# Patient Record
Sex: Male | Born: 1965 | Race: Black or African American | Hispanic: No | Marital: Single | Smoking: Former smoker
Health system: Southern US, Community
[De-identification: ages and names within clinical notes are randomized; demographics above are authoritative.]

## PROBLEM LIST (undated history)

## (undated) DIAGNOSIS — I10 Essential (primary) hypertension: Secondary | ICD-10-CM

## (undated) HISTORY — DX: Essential (primary) hypertension: I10

---

## 2007-01-04 ENCOUNTER — Emergency Department: Payer: Self-pay | Admitting: Emergency Medicine

## 2007-07-05 ENCOUNTER — Emergency Department: Payer: Self-pay | Admitting: Emergency Medicine

## 2007-07-05 ENCOUNTER — Other Ambulatory Visit: Payer: Self-pay

## 2008-11-23 ENCOUNTER — Emergency Department: Payer: Self-pay | Admitting: Emergency Medicine

## 2009-01-16 ENCOUNTER — Emergency Department (HOSPITAL_COMMUNITY): Admission: EM | Admit: 2009-01-16 | Discharge: 2009-01-17 | Payer: Self-pay | Admitting: Emergency Medicine

## 2009-01-16 ENCOUNTER — Emergency Department (HOSPITAL_COMMUNITY): Admission: EM | Admit: 2009-01-16 | Discharge: 2009-01-16 | Payer: Self-pay | Admitting: Emergency Medicine

## 2009-02-01 IMAGING — CT CT HEAD WITHOUT CONTRAST
2 series · 16 of 30 positions shown, 20 images · non-contrast
Comparison: none

REASON FOR EXAM: Dizziness
COMMENTS:

[Series 2: without · axial · non-contrast · 0.40mm/px · z∈[+234,+354]mm · 13 of 30 slices shown, 17 images]
[im 3/30  brain]
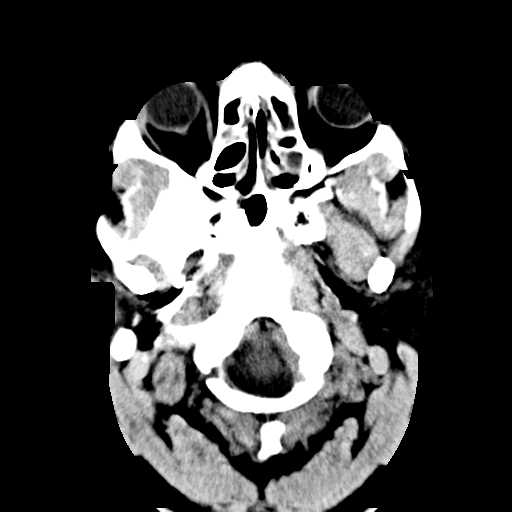
[im 3/30  bone]
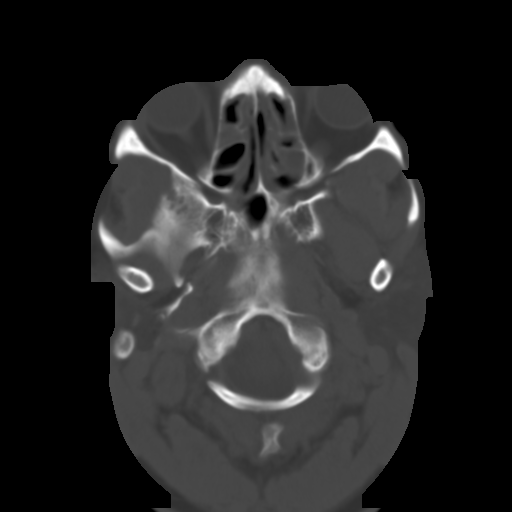
[im 5/30  brain]
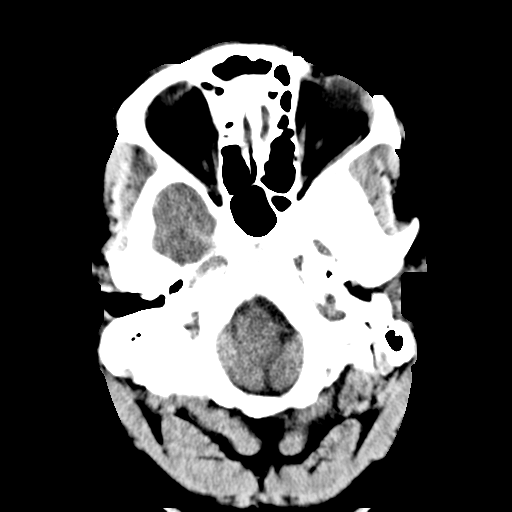
[im 7/30  brain]
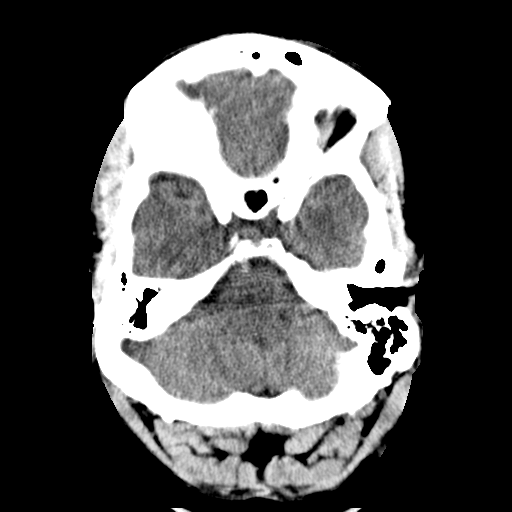
[im 9/30  brain]
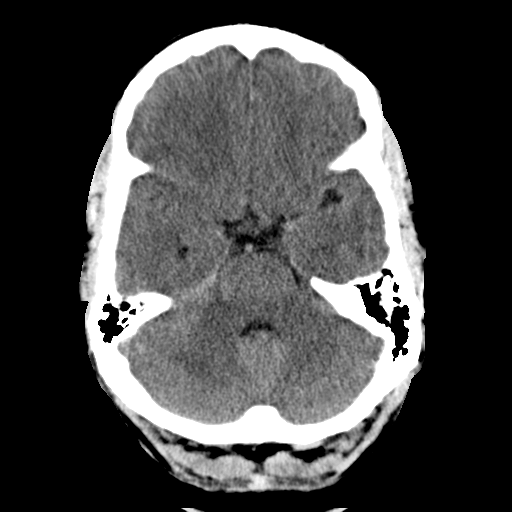
[im 11/30  brain]
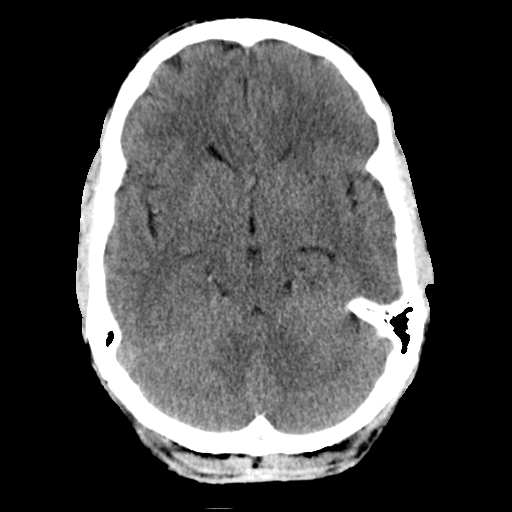
[im 11/30  bone]
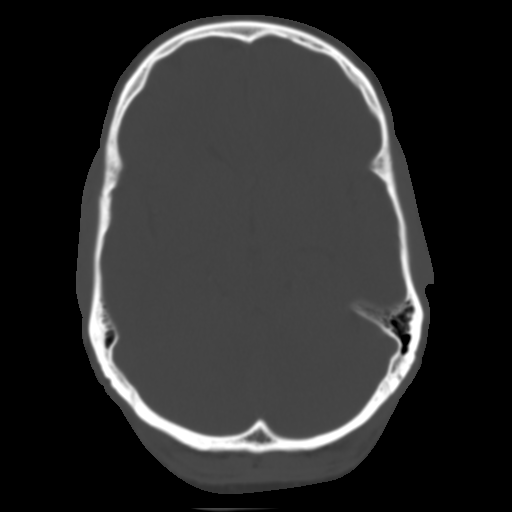
[im 13/30  brain]
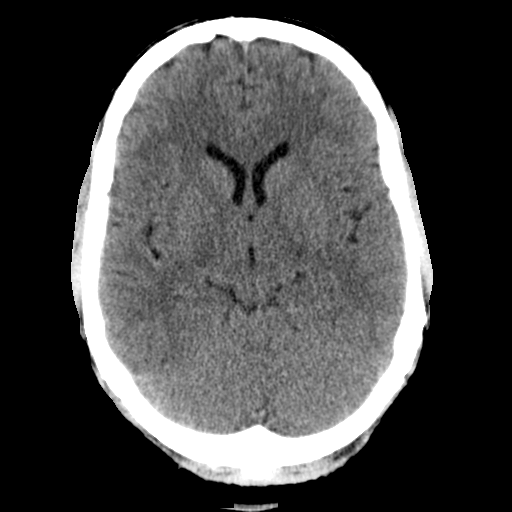
[im 15/30  brain]
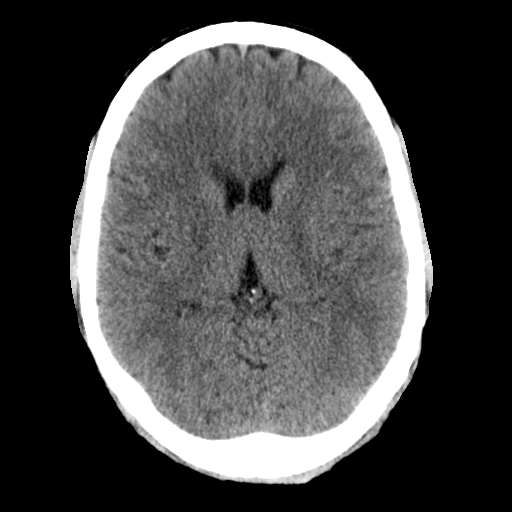
[im 17/30  brain]
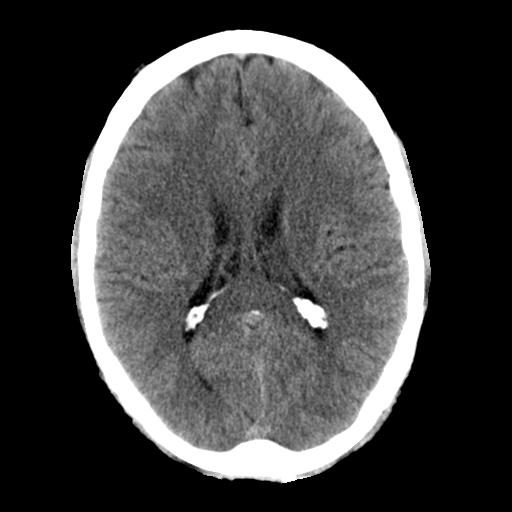
[im 19/30  brain]
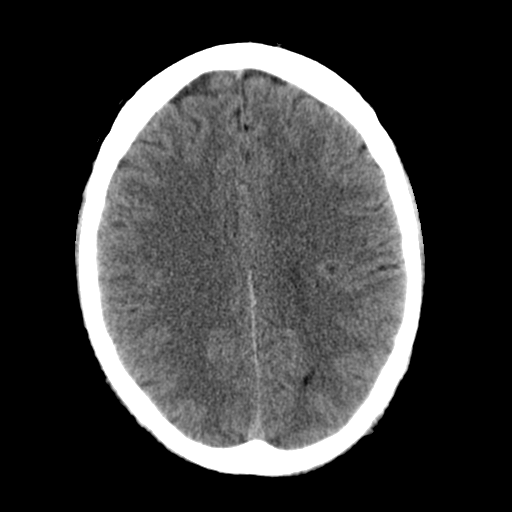
[im 19/30  bone]
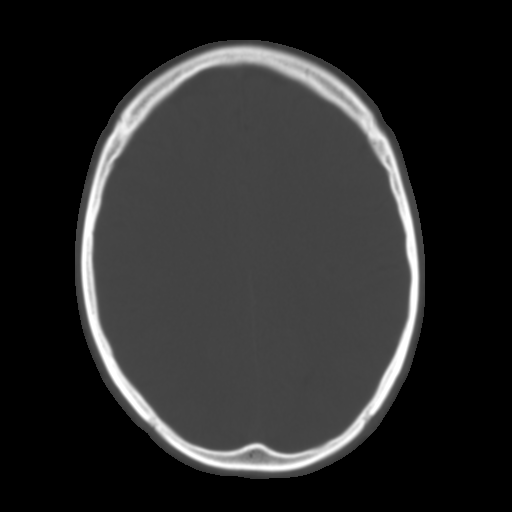
[im 21/30  brain]
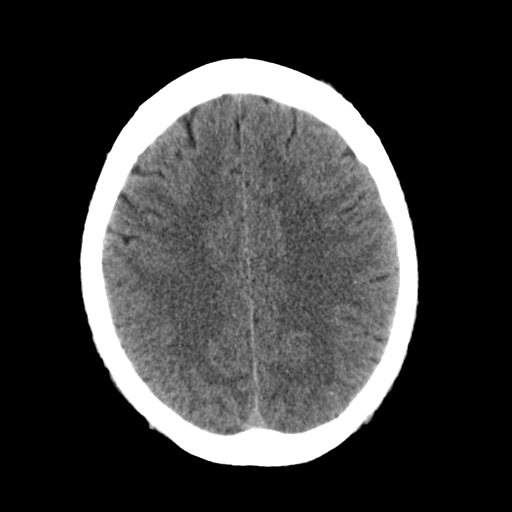
[im 23/30  brain]
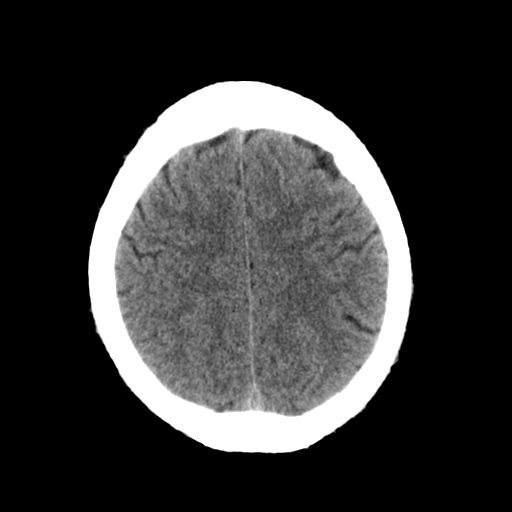
[im 25/30  brain]
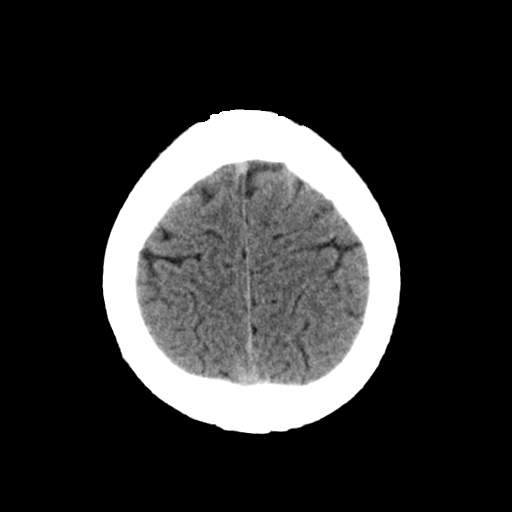
[im 27/30  brain]
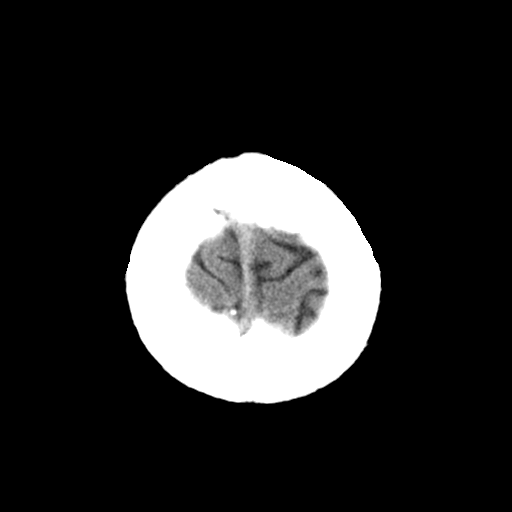
[im 27/30  bone]
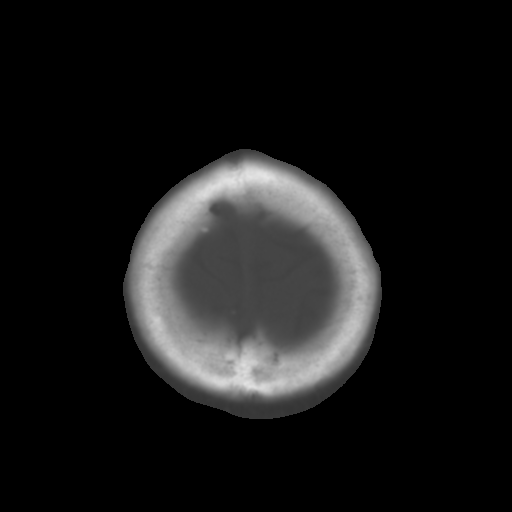

[Series 3: bone · axial · 0.40mm/px · z∈[+234,+274]mm · 3 of 30 slices shown]
[im 3/30  bone]
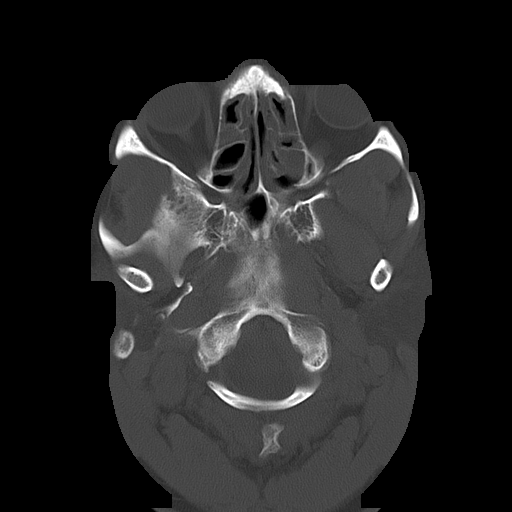
[im 7/30  bone]
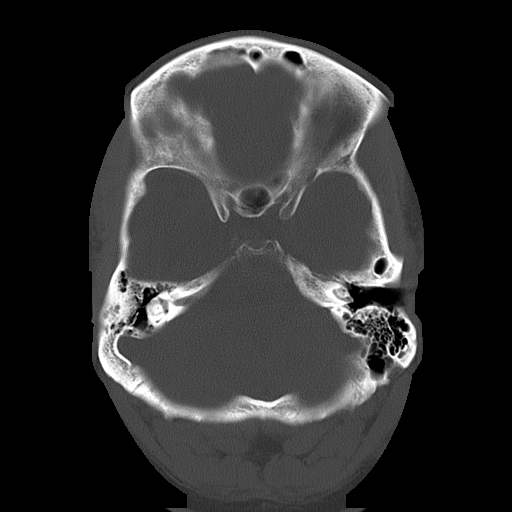
[im 11/30  bone]
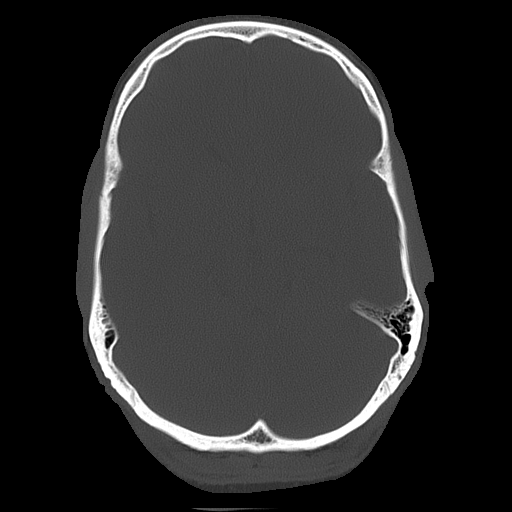

[16 of 30 positions shown; findings below may reference images not displayed]

PROCEDURE:     CT  - CT HEAD WITHOUT CONTRAST  - July 05, 2007  [DATE]

RESULT:     There is no evidence of intra-axial or extra-axial fluid
collections. There is no evidence of acute hemorrhage or secondary signs
reflecting mass effect or subacute or chronic infarction. The visualized
bony skeleton is evaluated and there is no evidence of depressed skull
fracture or further fracture or dislocation. The visualized mastoid air
cells are clear. The ventricles, cisterns and sulci are symmetric and
patent.
IMPRESSION: 1.     No evidence of acute intracranial abnormalities as described above.
2.     Dr. Pallina of the Emergency Department was informed of these findings
via preliminary faxed report on 07/05/2007 at [DATE], Central Standard Time.

## 2009-07-04 ENCOUNTER — Emergency Department: Payer: Self-pay | Admitting: Emergency Medicine

## 2010-06-23 IMAGING — CR DG LUMBAR SPINE 2-3V
1 series · 3 of 3 positions shown · non-contrast
Comparison: none

REASON FOR EXAM: back pain
COMMENTS:

[Series 1: view not recorded · 0.17mm/px · 3 of 3 slices shown]
[im 1/3]
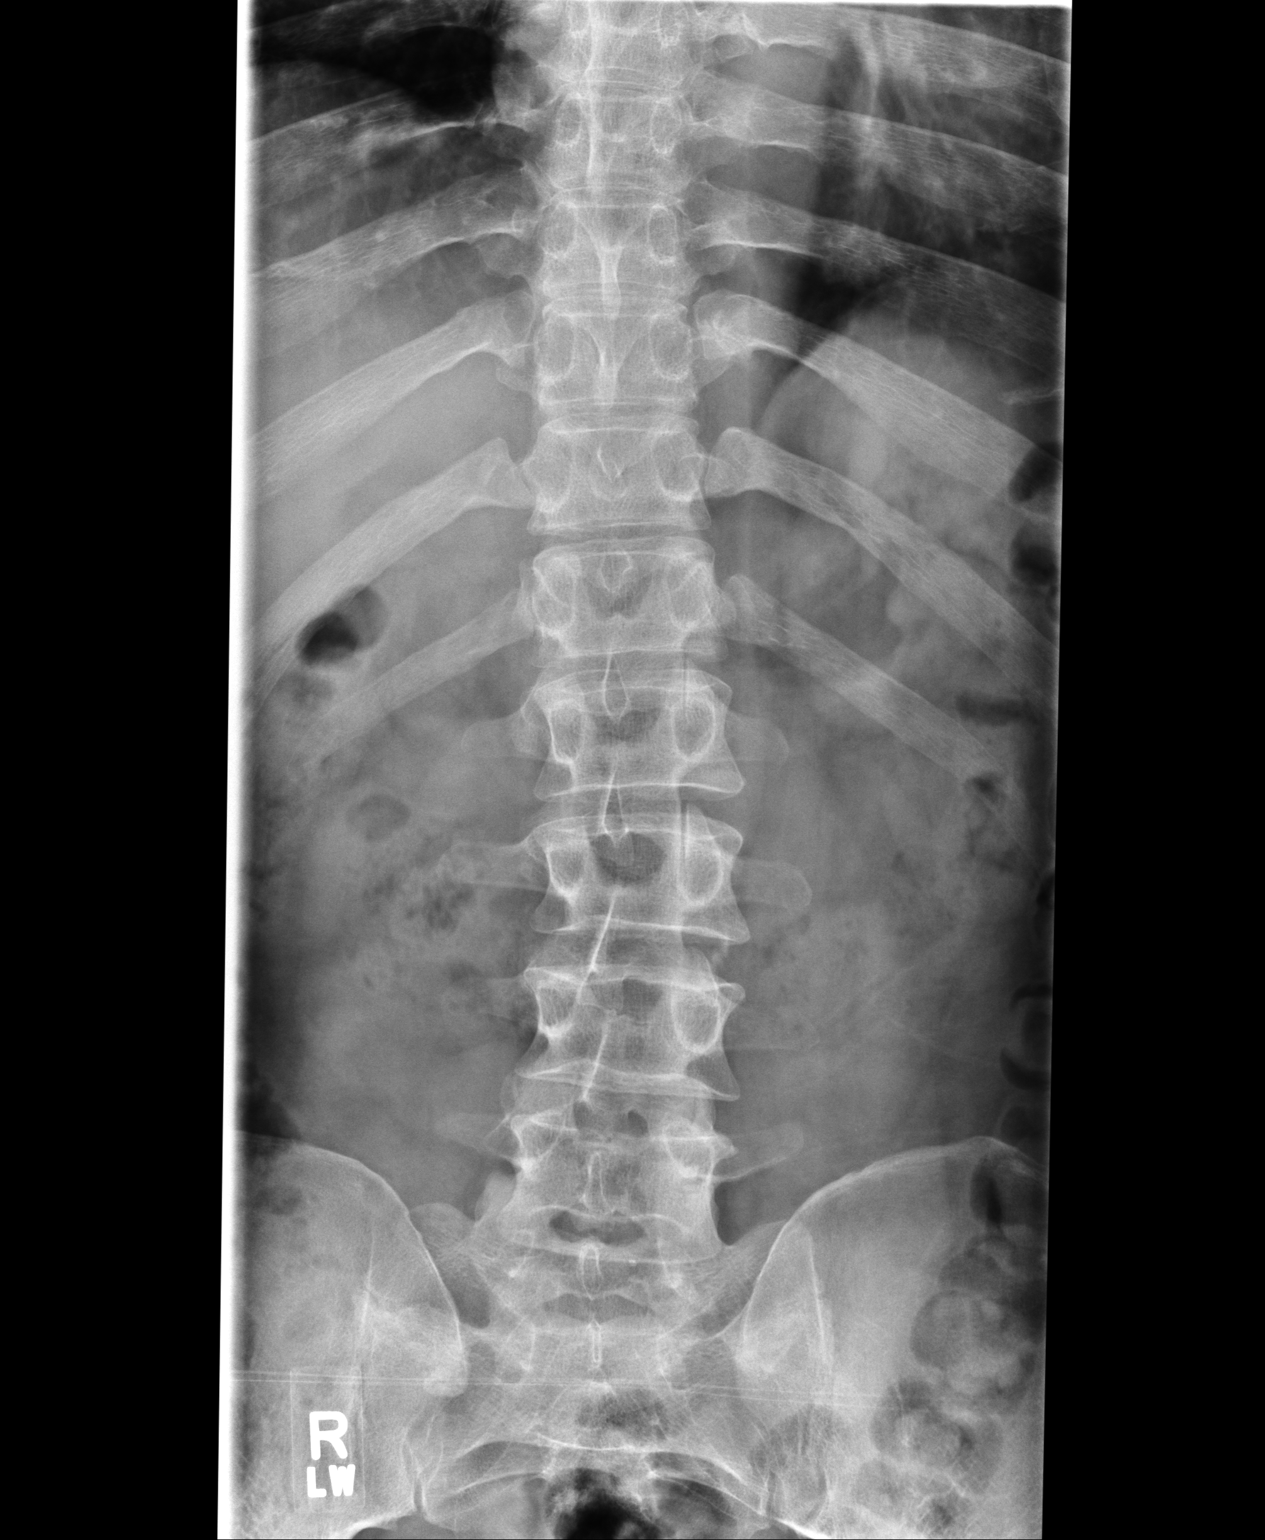
[im 2/3]
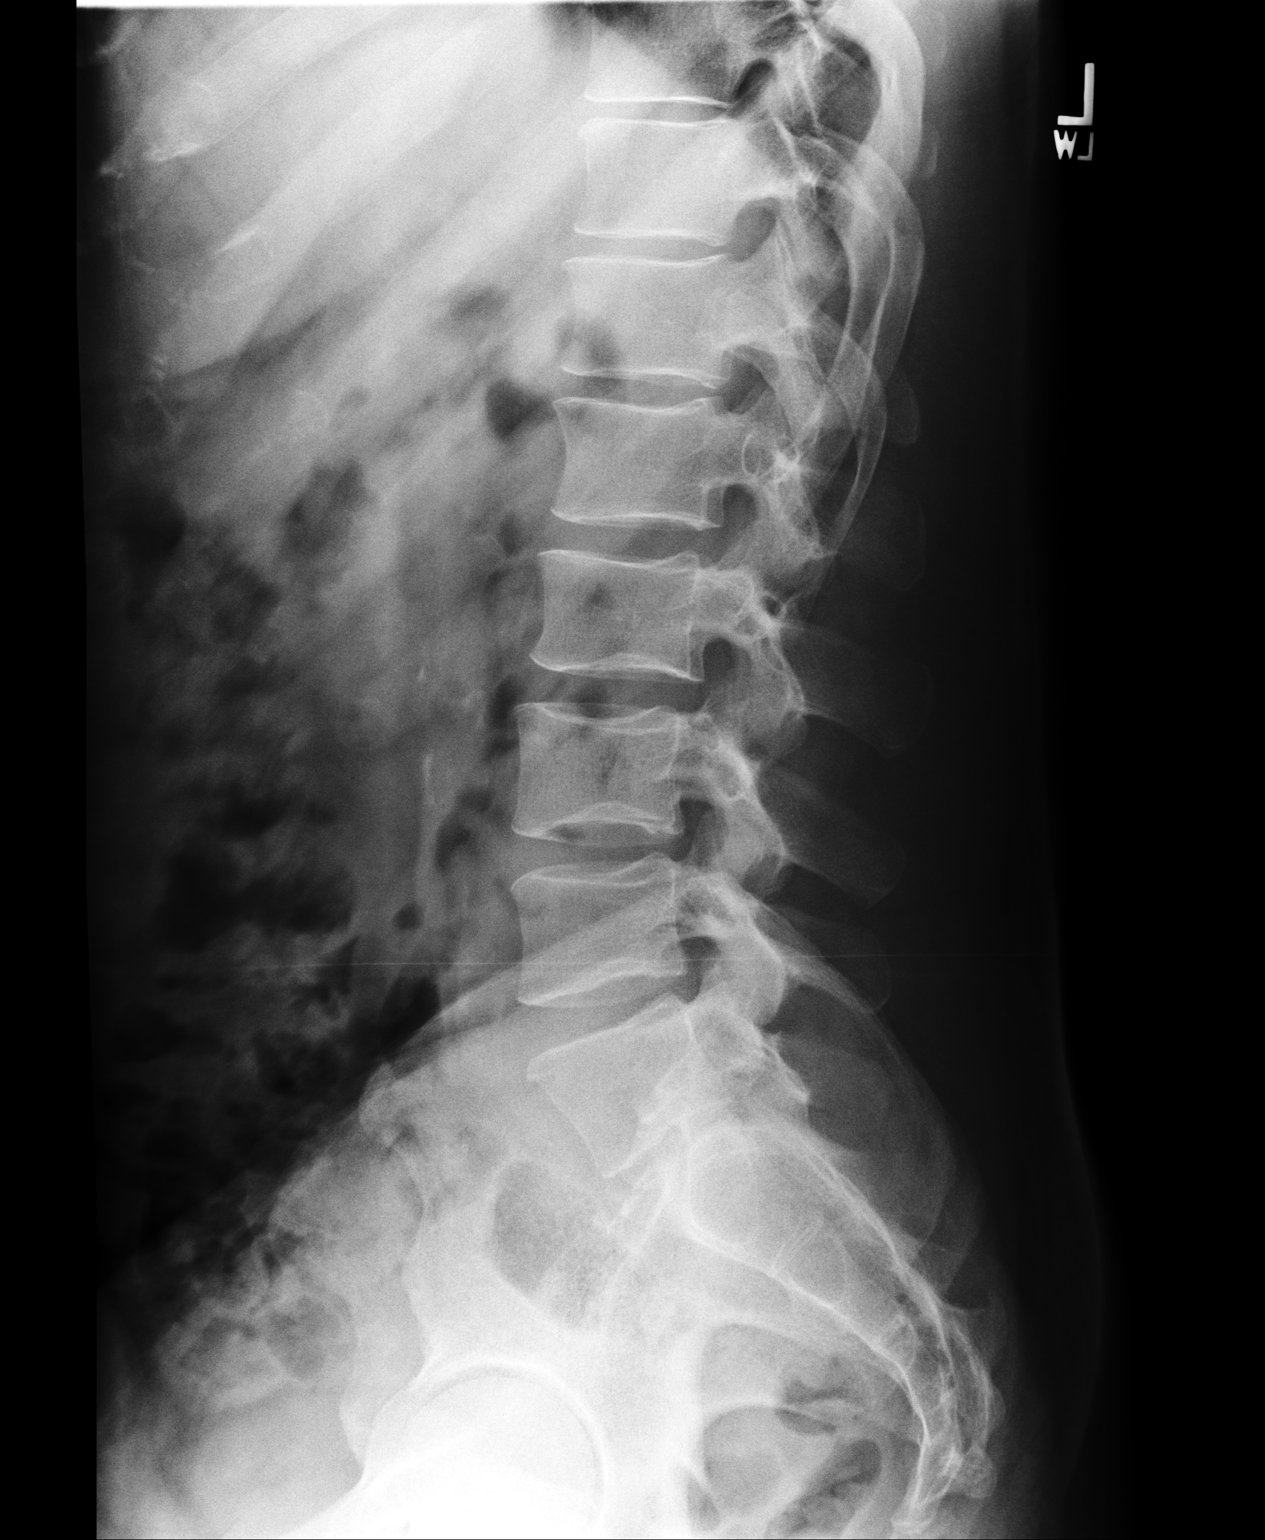
[im 3/3]
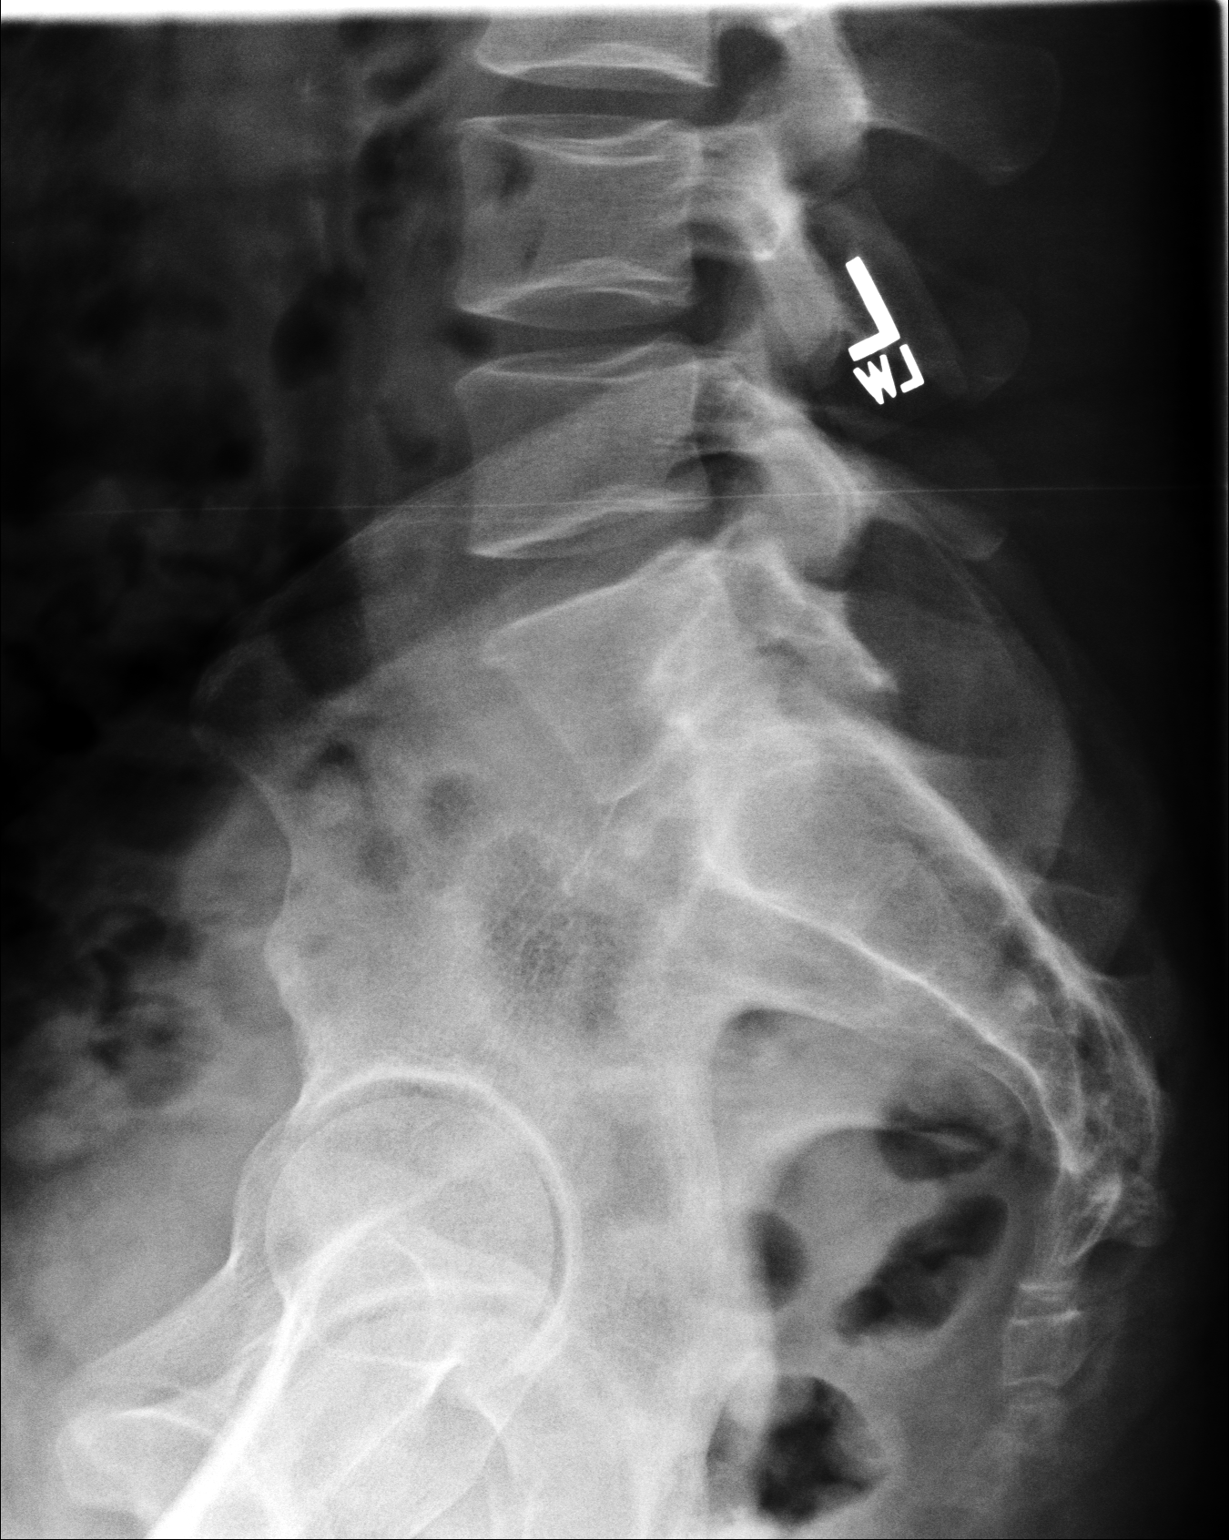

[3 of 3 positions shown; findings below may reference images not displayed]

PROCEDURE:     DXR - DXR LUMBAR SPINE AP AND LATERAL  - November 23, 2008  [DATE]

RESULT:     The vertebral body heights and the intervertebral disc spaces
are well maintained. Vertebral body alignment is normal. The pedicles are
bilaterally intact. In the AP view, there is noted a very slight lumbar
scoliosis with a convexity to the left.
IMPRESSION: 1. No acute bony abnormalities are identified.
2. There is a very slight lumbar scoliosis with a convexity to the left.

## 2010-06-23 IMAGING — CR CERVICAL SPINE - COMPLETE 4+ VIEW
1 series · 6 of 6 positions shown · non-contrast
Comparison: none

REASON FOR EXAM: neck pain
COMMENTS:

[Series 1: view not recorded · 0.17mm/px · 6 of 6 slices shown]
[im 1/6]
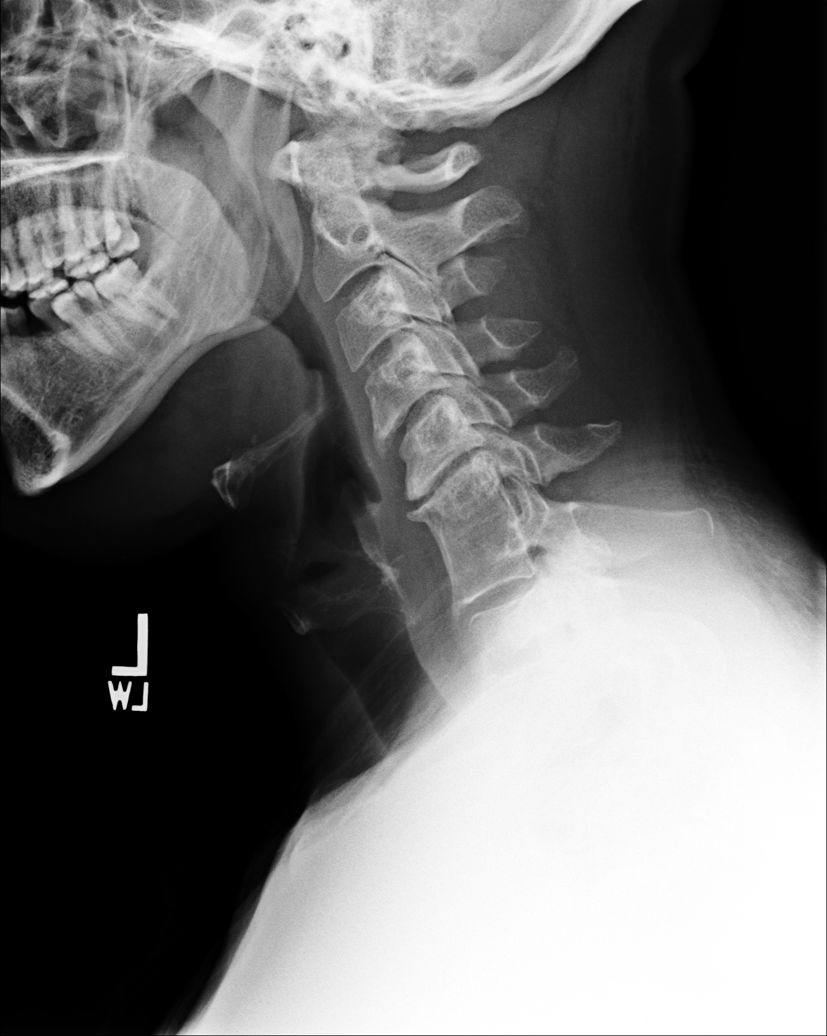
[im 2/6]
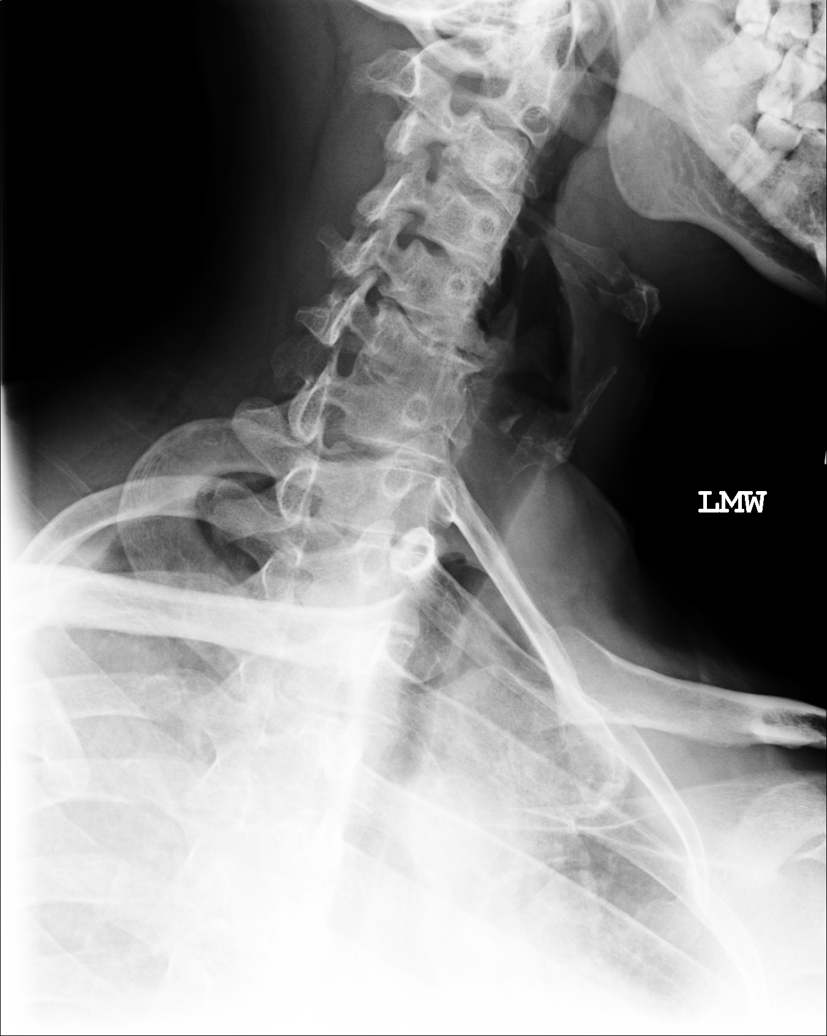
[im 3/6]
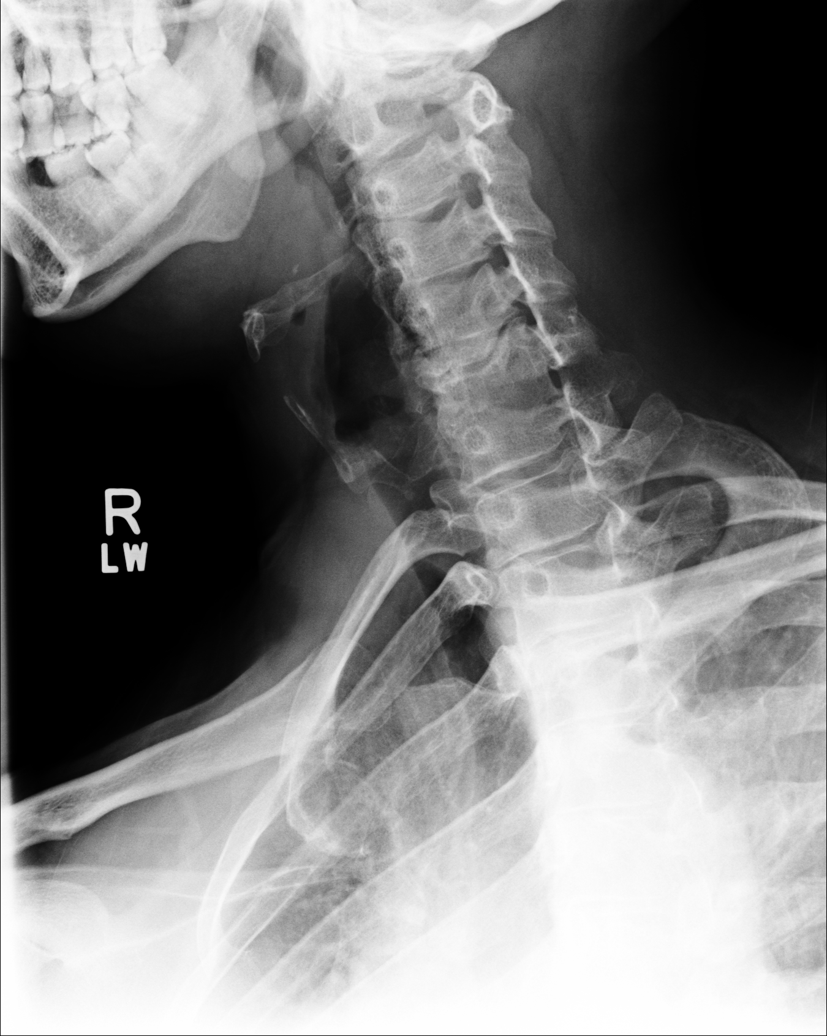
[im 4/6]
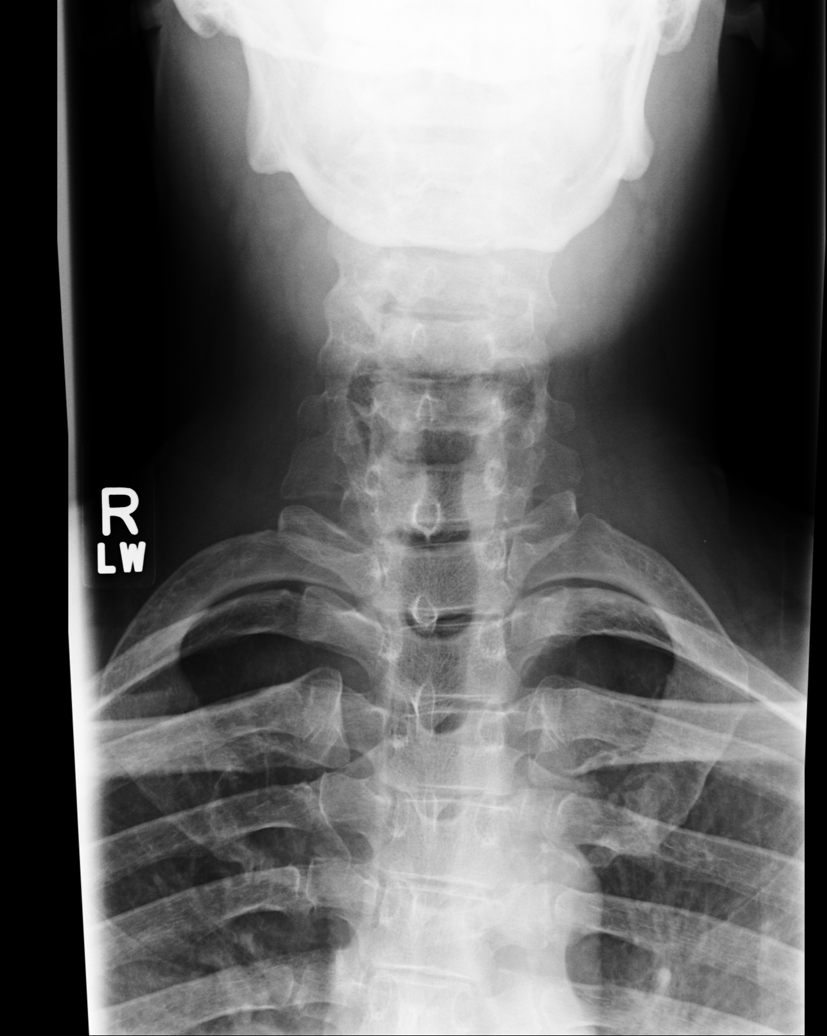
[im 5/6]
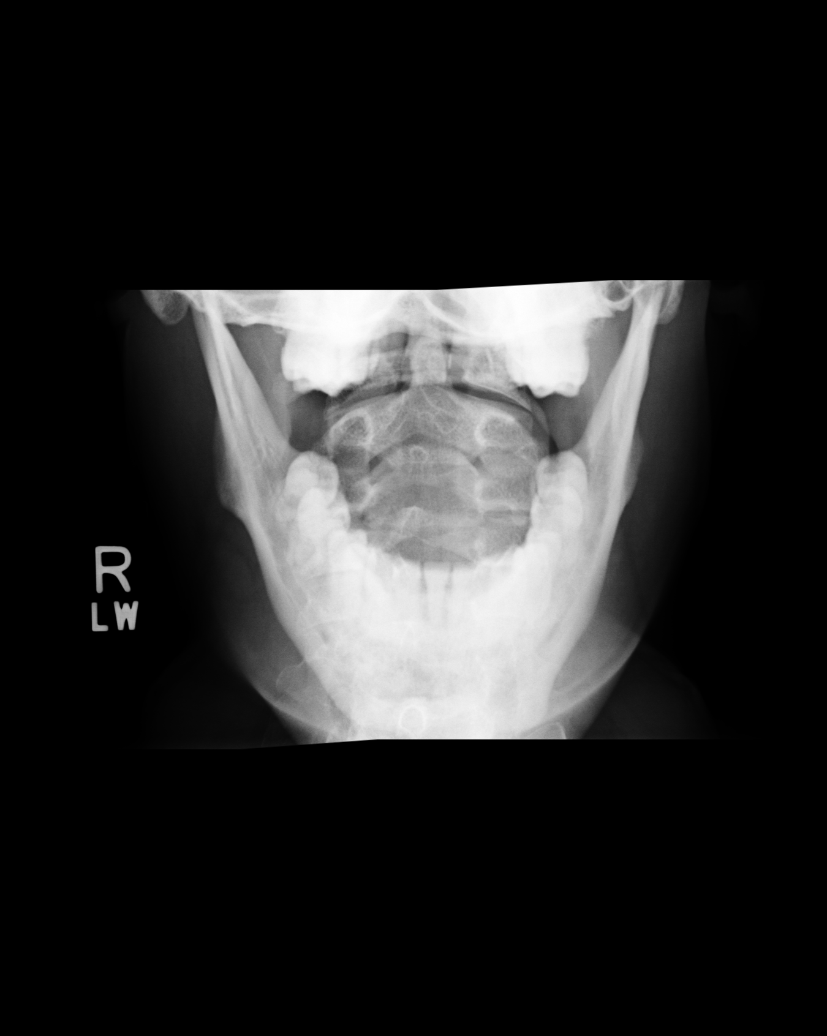
[im 6/6]
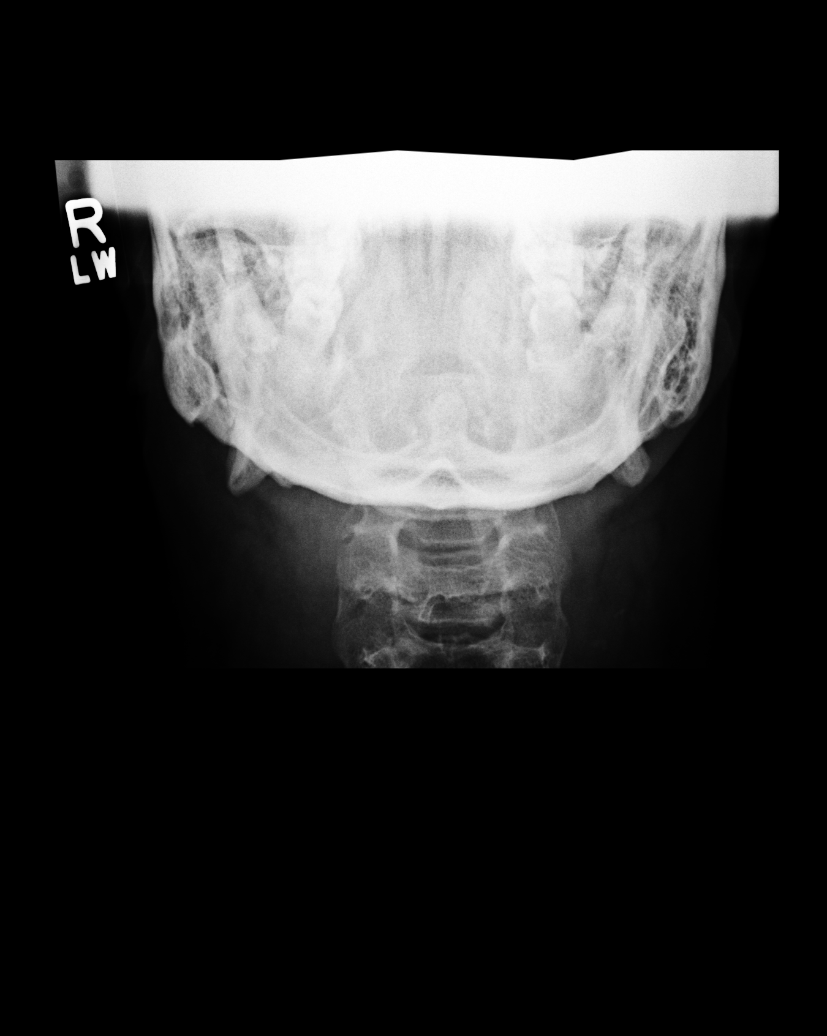

[6 of 6 positions shown; findings below may reference images not displayed]

PROCEDURE:     DXR - DXR CERVICAL SPINE COMPLETE  - November 23, 2008  [DATE]

RESULT:     There is narrowing of the C5-C6 cervical disc space compatible
with cervical disc disease. Oblique views show spur impingement on the
neural foramina bilaterally at this level.

Also noted is fusion of the C6 and C7 cervical vertebral bodies, most likely
of developmental origin. There is anterior spur formation at C4 through C7.
In the lateral view, there is straightening of the cervical spine. The
finding is nonspecific and may be chronic or secondary to cervical muscle
spasm. The odontoid process is intact. No cervical rib formation is seen.
IMPRESSION: 1. No fracture is identified.
2. There are changes of cervical disc disease at C5-C6 with there being mild
bilateral spur impingement on the neural foramina at this level.
3. There is fusion of the C6 and C7 cervical vertebral bodies, likely of
congenital origin.
4. There is straightening of the cervical spine which could either be
chronic or secondary to cervical muscle spasm.

## 2010-11-20 ENCOUNTER — Emergency Department: Payer: Self-pay | Admitting: Emergency Medicine

## 2010-12-15 ENCOUNTER — Emergency Department: Payer: Self-pay | Admitting: *Deleted

## 2021-03-22 ENCOUNTER — Ambulatory Visit: Payer: Medicaid Other | Admitting: Internal Medicine

## 2021-03-22 ENCOUNTER — Encounter: Payer: Self-pay | Admitting: Internal Medicine

## 2021-03-22 ENCOUNTER — Other Ambulatory Visit: Payer: Self-pay | Admitting: Internal Medicine

## 2021-03-22 ENCOUNTER — Other Ambulatory Visit: Payer: Self-pay

## 2021-03-22 VITALS — BP 190/111 | HR 83 | Temp 99.3°F | Resp 20 | Ht 68.0 in | Wt 217.2 lb

## 2021-03-22 DIAGNOSIS — D649 Anemia, unspecified: Secondary | ICD-10-CM | POA: Insufficient documentation

## 2021-03-22 DIAGNOSIS — N189 Chronic kidney disease, unspecified: Secondary | ICD-10-CM

## 2021-03-22 DIAGNOSIS — N179 Acute kidney failure, unspecified: Secondary | ICD-10-CM

## 2021-03-22 DIAGNOSIS — I5022 Chronic systolic (congestive) heart failure: Secondary | ICD-10-CM | POA: Insufficient documentation

## 2021-03-22 DIAGNOSIS — G4733 Obstructive sleep apnea (adult) (pediatric): Secondary | ICD-10-CM | POA: Insufficient documentation

## 2021-03-22 DIAGNOSIS — I160 Hypertensive urgency: Secondary | ICD-10-CM | POA: Insufficient documentation

## 2021-03-22 DIAGNOSIS — I2510 Atherosclerotic heart disease of native coronary artery without angina pectoris: Secondary | ICD-10-CM | POA: Insufficient documentation

## 2021-03-22 DIAGNOSIS — N183 Chronic kidney disease, stage 3 unspecified: Secondary | ICD-10-CM

## 2021-03-22 LAB — POCT URINALYSIS DIPSTICK
Bilirubin, UA: NEGATIVE
Blood, UA: NEGATIVE
Glucose, UA: NEGATIVE
Ketones, UA: 5
Nitrite, UA: NEGATIVE
Odor: NORMAL
Protein, UA: NEGATIVE
Spec Grav, UA: >=1.03 — AB (ref 1.010–1.025)
Urobilinogen, UA: 0.2 E.U./dL
pH, UA: 6 (ref 5.0–8.0)

## 2021-03-22 MED ORDER — CLONIDINE HCL 0.2 MG PO TABS: 0.2000 mg | ORAL_TABLET | Freq: Two times a day (BID) | ORAL | 1 refills | Status: AC

## 2021-03-22 MED ORDER — CARVEDILOL 25 MG PO TABS: 25.0000 mg | ORAL_TABLET | Freq: Two times a day (BID) | ORAL | 3 refills | Status: AC

## 2021-03-22 NOTE — Progress Notes (Unsigned)
New Patient Office Visit  Subjective:  Patient ID: Terry Oconnell, male    DOB: June 07, 1965  Age: 55 y.o. MRN: [id number]  CC:  No chief complaint on file.   HPI HPI   Past Medical History:  Diagnosis Date   Hypertension     History reviewed. No pertinent surgical history.  Family History  Problem Relation Age of Onset   Cancer Mother    Diabetes Father    Cancer Sister     Social History   Socioeconomic History   Marital status: Single    Spouse name: Not on file   Number of children: Not on file   Years of education: Not on file   Highest education level: Not on file  Occupational History   Not on file  Tobacco Use   Smoking status: Former    Types: Cigarettes   Smokeless tobacco: Current  Vaping Use   Vaping Use: Former  Substance and Sexual Activity   Alcohol use: Not Currently   Drug use: Yes   Sexual activity: Not on file  Other Topics Concern   Not on file  Social History Narrative   Not on file   Social Determinants of Health   Financial Resource Strain: Not on file  Food Insecurity: Not on file  Transportation Needs: Not on file  Physical Activity: Not on file  Stress: Not on file  Social Connections: Not on file  Intimate Partner Violence: Not on file    ROS Review of Systems  Constitutional: Negative.   HENT: Negative.    Eyes: Negative.   Respiratory: Negative.    Cardiovascular: Negative.   Gastrointestinal: Negative.   Endocrine: Negative.   Genitourinary: Negative.   Musculoskeletal: Negative.   Allergic/Immunologic: Negative.   Neurological: Negative.   Hematological: Negative.   Psychiatric/Behavioral: Negative.     Objective:   Today's Vitals: BP (!) 190/111 (BP Location: Right Arm, Patient Position: Sitting, Cuff Size: Normal)   Pulse 83   Temp 99.3 F (37.4 C) (Temporal)   Resp 20   Ht 5\' 8"  (1.727 m)   Wt 217 lb 4 oz (98.5 kg)   SpO2 98%   BMI 33.03 kg/m   PHYSICAL EXAM Physical Exam Constitutional:       General: He is not in acute distress.    Appearance: He is obese. He is not ill-appearing, toxic-appearing or diaphoretic.  HENT:     Head: Normocephalic and atraumatic.     Right Ear: Tympanic membrane normal.     Left Ear: Tympanic membrane normal.     Nose: Nose normal.     Mouth/Throat:     Mouth: Mucous membranes are moist.     Pharynx: Oropharynx is clear.  Eyes:     Extraocular Movements: Extraocular movements intact.     Pupils: Pupils are equal, round, and reactive to light.     Comments: Mild pallor  Cardiovascular:     Rate and Rhythm: Normal rate and regular rhythm.     Pulses: Normal pulses.     Heart sounds: Normal heart sounds.  Pulmonary:     Effort: Pulmonary effort is normal.     Breath sounds: Normal breath sounds.  Abdominal:     General: Abdomen is flat.     Palpations: Abdomen is soft.     Comments: Obese abdomen  Musculoskeletal:     Cervical back: Normal range of motion and neck supple.     Right lower leg: No edema.  Left lower leg: No edema.  Neurological:     General: No focal deficit present.     Mental Status: He is alert and oriented to person, place, and time. Mental status is at baseline.  Psychiatric:        Mood and Affect: Mood normal.        Behavior: Behavior normal.        Thought Content: Thought content normal.        Judgment: Judgment normal.    Assessment & Plan:   Problem List Items Addressed This Visit       Cardiovascular and Mediastinum   Hypertensive urgency    -DC Metoprolol  -Start Coreg 25 Mg PO bid -Continue other medications -Check BP at home, document readings and review provider.      Relevant Medications   amLODipine (NORVASC) 10 MG tablet   hydrALAZINE (APRESOLINE) 100 MG tablet   carvedilol (COREG) 25 MG tablet   cloNIDine (CATAPRES) 0.2 MG tablet   Other Relevant Orders   POCT Urinalysis Dipstick   Protein / creatinine ratio, urine   BASIC METABOLIC PANEL WITH GFR   Parathyroid hormone,  intact (no Ca)   VITAMIN D 25 Hydroxy (Vit-D Deficiency, Fractures)   CAD (coronary artery disease)   Relevant Medications   amLODipine (NORVASC) 10 MG tablet   hydrALAZINE (APRESOLINE) 100 MG tablet   carvedilol (COREG) 25 MG tablet   cloNIDine (CATAPRES) 0.2 MG tablet   Chronic systolic CHF (congestive heart failure) (HCC)   Relevant Medications   amLODipine (NORVASC) 10 MG tablet   hydrALAZINE (APRESOLINE) 100 MG tablet   carvedilol (COREG) 25 MG tablet   cloNIDine (CATAPRES) 0.2 MG tablet     Respiratory   OSA (obstructive sleep apnea)    -Sleep studies to rule OSA        Genitourinary   Acute kidney injury superimposed on chronic kidney disease (HCC)     Other   Anemia   Other Visit Diagnoses     Acute renal failure superimposed on stage 3 chronic kidney disease, unspecified acute renal failure type, unspecified whether stage 3a or 3b CKD (HCC)    -  Primary   Relevant Orders   POCT Urinalysis Dipstick   Protein / creatinine ratio, urine   BASIC METABOLIC PANEL WITH GFR   Parathyroid hormone, intact (no Ca)   VITAMIN D 25 Hydroxy (Vit-D Deficiency, Fractures)       Outpatient Encounter Medications as of 03/22/2021  Medication Sig   amLODipine (NORVASC) 10 MG tablet Take 2 tablets by mouth in the morning and at bedtime.   carvedilol (COREG) 25 MG tablet Take 1 tablet (25 mg total) by mouth 2 (two) times daily with a meal.   hydrALAZINE (APRESOLINE) 100 MG tablet Take 1 tablet by mouth 2 (two) times daily.   prasugrel (EFFIENT) 10 MG TABS tablet Take 1 tablet by mouth once.   [DISCONTINUED] cloNIDine (CATAPRES) 0.2 MG tablet Take 0.2 mg by mouth 2 (two) times daily.   [DISCONTINUED] metoprolol succinate (TOPROL-XL) 50 MG 24 hr tablet Take 1 tablet by mouth 2 (two) times daily.   acetaminophen (TYLENOL) 500 MG tablet Take 1 tablet by mouth 2 (two) times daily. As needed every 6 hours   albuterol (VENTOLIN HFA) 108 (90 Base) MCG/ACT inhaler Inhale 2 puffs into the  lungs as needed.   cetirizine (ZYRTEC) 10 MG tablet Take 1 tablet by mouth as needed.   cloNIDine (CATAPRES) 0.2 MG tablet Take 1 tablet (0.2 mg  total) by mouth 2 (two) times daily.   No facility-administered encounter medications on file as of 03/22/2021.    Follow-up: Return in about 2 weeks (around 04/05/2021).   Barnetta Chapel, MD

## 2021-03-22 NOTE — Assessment & Plan Note (Signed)
-  Sleep studies to rule OSA

## 2021-03-22 NOTE — Assessment & Plan Note (Signed)
-  DC Metoprolol  -Start Coreg 25 Mg PO bid -Continue other medications -Check BP at home, document readings and review provider.

## 2021-03-23 LAB — PARATHYROID HORMONE, INTACT (NO CA): PTH: 44 pg/mL (ref 16–77)

## 2021-03-23 LAB — BASIC METABOLIC PANEL WITH GFR
BUN/Creatinine Ratio: 13 (calc) (ref 6–22)
BUN: 20 mg/dL (ref 7–25)
CO2: 23 mmol/L (ref 20–32)
Calcium: 10 mg/dL (ref 8.6–10.3)
Chloride: 105 mmol/L (ref 98–110)
Creat: 1.55 mg/dL — ABNORMAL HIGH (ref 0.70–1.30)
Glucose, Bld: 88 mg/dL (ref 65–99)
Potassium: 4.1 mmol/L (ref 3.5–5.3)
Sodium: 138 mmol/L (ref 135–146)
eGFR: 53 mL/min/{1.73_m2} — ABNORMAL LOW (ref >=60)

## 2021-03-23 LAB — PROTEIN / CREATININE RATIO, URINE
Creatinine, Urine: 145 mg/dL (ref 20–320)
Protein/Creat Ratio: 117 mg/g creat (ref 25–148)
Protein/Creatinine Ratio: 0.117 mg/mg creat (ref 0.025–0.148)
Total Protein, Urine: 17 mg/dL (ref 5–25)

## 2021-03-23 LAB — VITAMIN D 25 HYDROXY (VIT D DEFICIENCY, FRACTURES): Vit D, 25-Hydroxy: 21 ng/mL — ABNORMAL LOW (ref 30–100)

## 2021-03-27 ENCOUNTER — Other Ambulatory Visit: Payer: Self-pay | Admitting: Internal Medicine

## 2021-04-05 ENCOUNTER — Ambulatory Visit: Payer: Medicaid Other | Admitting: Internal Medicine

## 2022-05-26 ENCOUNTER — Other Ambulatory Visit: Payer: Self-pay | Admitting: Internal Medicine
